# Patient Record
Sex: Female | Born: 1950 | Race: White | Hispanic: No | Smoking: Never smoker
Health system: Southern US, Community
[De-identification: ages and names within clinical notes are randomized; demographics above are authoritative.]

---

## 2003-04-15 ENCOUNTER — Emergency Department (HOSPITAL_COMMUNITY): Admission: EM | Admit: 2003-04-15 | Discharge: 2003-04-15 | Payer: Self-pay | Admitting: Emergency Medicine

## 2005-10-29 ENCOUNTER — Ambulatory Visit (HOSPITAL_BASED_OUTPATIENT_CLINIC_OR_DEPARTMENT_OTHER): Admission: RE | Admit: 2005-10-29 | Discharge: 2005-10-29 | Payer: Self-pay | Admitting: Orthopedic Surgery

## 2010-06-12 ENCOUNTER — Ambulatory Visit: Payer: Self-pay | Admitting: Diagnostic Radiology

## 2010-06-12 ENCOUNTER — Emergency Department (HOSPITAL_BASED_OUTPATIENT_CLINIC_OR_DEPARTMENT_OTHER): Admission: EM | Admit: 2010-06-12 | Discharge: 2010-06-13 | Payer: Self-pay | Admitting: Emergency Medicine

## 2010-06-16 ENCOUNTER — Emergency Department (HOSPITAL_BASED_OUTPATIENT_CLINIC_OR_DEPARTMENT_OTHER): Admission: EM | Admit: 2010-06-16 | Discharge: 2010-06-16 | Payer: Self-pay | Admitting: Emergency Medicine

## 2010-06-16 ENCOUNTER — Ambulatory Visit: Payer: Self-pay | Admitting: Diagnostic Radiology

## 2011-01-02 LAB — URINALYSIS, ROUTINE W REFLEX MICROSCOPIC
Glucose, UA: NEGATIVE mg/dL
Protein, ur: NEGATIVE mg/dL
Urobilinogen, UA: 0.2 mg/dL (ref 0.0–1.0)

## 2011-01-02 LAB — BASIC METABOLIC PANEL
BUN: 12 mg/dL (ref 6–23)
CO2: 27 mEq/L (ref 19–32)
Calcium: 9 mg/dL (ref 8.4–10.5)
Chloride: 108 mEq/L (ref 96–112)
Creatinine, Ser: 0.9 mg/dL (ref 0.4–1.2)
GFR calc non Af Amer: >60 mL/min (ref >60)
Glucose, Bld: 86 mg/dL (ref 70–99)
Sodium: 142 mEq/L (ref 135–145)

## 2011-01-02 LAB — URINE MICROSCOPIC-ADD ON

## 2011-03-06 NOTE — Op Note (Signed)
Morgan, Woodard              ACCOUNT NO.:  [id number]   MEDICAL RECORD NO.:  [id number]          PATIENT TYPE:  AMB   LOCATION:  DSC                          FACILITY:  [facility]   PHYSICIAN:  Rodney A. Mortenson, M.D.DATE OF BIRTH:  [birth year]   DATE OF PROCEDURE:  10/29/2005  DATE OF DISCHARGE:                                 OPERATIVE REPORT   PREOPERATIVE DIAGNOSIS:  Partial versus full thickness rotator cuff tear,  right shoulder.   POSTOPERATIVE DIAGNOSIS:  Impingement syndrome with secondary full thickness  nonretracted rotator cuff tear, supraspinatus, right shoulder.   OPERATION PERFORMED:  Diagnostic arthroscopy; open acromioplasty and  excision of full thickness degenerative tear and repair of full thickness  tear, rotator cuff, right shoulder.   SURGEON:  Lenard Galloway. Chaney Malling, M.D.   ANESTHESIA:  General.   DESCRIPTION OF PROCEDURE:  Patient placed on the operating table in supine  position.  After satisfactory general anesthesia, the patient was placed in  semiseated position.  The right upper extremity and shoulder were then  prepped with DuraPrep and draped out in the usual manner. Through a  posterior operative portal, the arthroscope was introduced.  An anterior  operative portal was then made and an operative cannula was inserted.  A  very careful examination of the shoulder was undertaken.  The articular  cartilage of the humeral head and the glenoid appeared normal.  The biceps  appeared normal throughout its course.  The labrum is normal.  Adjacent to  the biceps there is marked fraying and tearing of the articular surface of  the supraspinatus just proximal to its distal insertion.  An 18 gauge needle  was then inserted transcutaneously from outside the shoulder down into the  area of the tear.  This was then stained with methylene blue and the needle  was withdrawn.  The arthroscope was then removed and placed in the  subacromial space. In the area of  the staining with the methylene blue there  was marked fraying and tearing of the cuff on the bursal side of the  shoulder and marked fraying and tearing communicated with the deeper areas.  This was a full thickness nondisplaced tear.  The arthroscope was removed.   A saber cut incision was made over the anterolateral aspect of the shoulder.  Skin edges were retracted.  Bleeders were coagulated.  Deltoid fibers were  released off the anterior aspect of the shoulder only.  The bursa was  excised.  The area of staining could clearly be seen and there was fraying  of the superficial surface of the bursal side of the cuff.  An acromioplasty  was then done and this gave excellent access to the area. The area that was  stained with methylene blue was then excised and degenerative tissue was  seen throughout the thickness of the cuff.  The edges were then sewn back  together side-to-side using Tycron sutures.  A watertight closure was  achieved.  Throughout the procedure, the shoulder was irrigated with copious  amounts of saline solution.  The deltoid fibers were then reattached with 0  Vicryl  and 2-0 Vicryl was used to close the subcutaneous tissue.  Stainless  steel staples used to close the skin.  A small dressing was applied and the  patient returned to the recovery room in excellent condition.  The patient  did have a shoulder block.   FOLLOW-UP CARE:  1.  To my office in [facility] on Thursday.  2.  At that time will start physical therapy.  3.  Percocet for pain.           ______________________________  Lenard Galloway. Chaney Malling, M.D.     RAM/MEDQ  D:  10/29/2005  T:  10/30/2005  Job:  [id number]

## 2011-11-07 ENCOUNTER — Other Ambulatory Visit: Payer: Self-pay

## 2011-11-07 ENCOUNTER — Encounter (HOSPITAL_COMMUNITY): Payer: Self-pay | Admitting: Emergency Medicine

## 2011-11-07 ENCOUNTER — Emergency Department (HOSPITAL_COMMUNITY): Payer: 59

## 2011-11-07 ENCOUNTER — Emergency Department (HOSPITAL_COMMUNITY)
Admission: EM | Admit: 2011-11-07 | Discharge: 2011-11-07 | Disposition: A | Discharge status: Home / Self Care | Payer: 59 | Attending: Emergency Medicine | Admitting: Emergency Medicine

## 2011-11-07 DIAGNOSIS — R269 Unspecified abnormalities of gait and mobility: Secondary | ICD-10-CM | POA: Insufficient documentation

## 2011-11-07 DIAGNOSIS — R109 Unspecified abdominal pain: Secondary | ICD-10-CM | POA: Insufficient documentation

## 2011-11-07 DIAGNOSIS — M549 Dorsalgia, unspecified: Secondary | ICD-10-CM

## 2011-11-07 DIAGNOSIS — M546 Pain in thoracic spine: Secondary | ICD-10-CM | POA: Insufficient documentation

## 2011-11-07 LAB — POCT I-STAT, CHEM 8
Creatinine, Ser: 0.8 mg/dL (ref 0.50–1.10)
Glucose, Bld: 104 mg/dL — ABNORMAL HIGH (ref 70–99)
HCT: 38 % (ref 36.0–46.0)
TCO2: 23 mmol/L (ref 0–100)

## 2011-11-07 MED ORDER — IBUPROFEN 800 MG PO TABS: 800.0000 mg | ORAL_TABLET | Freq: Three times a day (TID) | ORAL | Status: AC

## 2011-11-07 MED ORDER — ONDANSETRON HCL 4 MG/2ML IJ SOLN
4.0000 mg | Freq: Once | INTRAMUSCULAR | Status: AC
  Administered 2011-11-07: 4 mg via INTRAVENOUS
  Filled 2011-11-07: qty 2

## 2011-11-07 MED ORDER — IOHEXOL 300 MG/ML  SOLN
100.0000 mL | Freq: Once | INTRAMUSCULAR | Status: AC | PRN
  Administered 2011-11-07: 100 mL via INTRAVENOUS

## 2011-11-07 MED ORDER — OXYCODONE-ACETAMINOPHEN 5-325 MG PO TABS: 2.0000 | ORAL_TABLET | ORAL | Status: AC | PRN

## 2011-11-07 MED ORDER — FENTANYL CITRATE 0.05 MG/ML IJ SOLN
50.0000 ug | Freq: Once | INTRAMUSCULAR | Status: AC
  Administered 2011-11-07: 50 ug via INTRAVENOUS
  Filled 2011-11-07: qty 2

## 2011-11-07 MED ORDER — OXYCODONE-ACETAMINOPHEN 5-325 MG PO TABS
2.0000 | ORAL_TABLET | Freq: Once | ORAL | Status: AC
  Administered 2011-11-07: 2 via ORAL
  Filled 2011-11-07: qty 2
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
## 2011-11-07 NOTE — ED Notes (Signed)
Pt tolerating gingerale well. No distress.

## 2011-11-07 NOTE — ED Notes (Signed)
Pt ambulated with no difficulty.  Pt unable to find 2nd sock. Looked in room and sheets and with clothing and unable to find.  I did not see her with socks on, they had been removed when I assessed her.

## 2011-11-07 NOTE — ED Notes (Signed)
Attempted to ambulate pt pt refused states needs to wait a few more minutes for meds to "start working".
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
## 2017-05-31 ENCOUNTER — Encounter: Payer: Self-pay | Admitting: Emergency Medicine

## 2017-05-31 ENCOUNTER — Emergency Department (INDEPENDENT_AMBULATORY_CARE_PROVIDER_SITE_OTHER)
Admission: EM | Admit: 2017-05-31 | Discharge: 2017-05-31 | Disposition: A | Discharge status: Home / Self Care | Payer: 59 | Source: Home / Self Care | Attending: Family Medicine | Admitting: Family Medicine

## 2017-05-31 DIAGNOSIS — H9201 Otalgia, right ear: Secondary | ICD-10-CM

## 2017-05-31 DIAGNOSIS — H60331 Swimmer's ear, right ear: Secondary | ICD-10-CM | POA: Diagnosis not present

## 2017-05-31 MED ORDER — NEOMYCIN-POLYMYXIN-HC 3.5-10000-1 OT SUSP: 3.0000 [drp] | Freq: Three times a day (TID) | OTIC | 0 refills | Status: AC

## 2017-05-31 NOTE — ED Provider Notes (Signed)
[facility]    CSN: [id number] Arrival date & time: 05/31/17  1908     History   Chief Complaint Chief Complaint  Patient presents with  . Otalgia    HPI Morgan Woodard is a 66 y.o. female.   Patient states that she has been swimming recently and her right ear became clogged about 8 days ago, and she has had some discharge from her right ear.  During the past 2 to 3 days the discomfort has increased.  She has become fatigued, and developed chills/sweats.   The history is provided by the patient.    History reviewed. No pertinent past medical history.  There are no active problems to display for this patient.   History reviewed. No pertinent surgical history.  OB History    No data available       Home Medications    Prior to Admission medications   Medication Sig Start Date End Date Taking? Authorizing Provider  Multiple Vitamin (MULITIVITAMIN WITH MINERALS) TABS Take 1 tablet by mouth daily. 2 chewables every day    [provider]  neomycin-polymyxin-hydrocortisone (CORTISPORIN) 3.5-10000-1 OTIC suspension Place 3 drops into the right ear 3 (three) times daily. 05/31/17   Lattie Haw, MD  POTASSIUM PO Take 1 tablet by mouth daily.    [provider]    Family History No family history on file.  Social History Social History  Substance Use Topics  . Smoking status: Never Smoker  . Smokeless tobacco: Never Used  . Alcohol use No     Allergies   Vicodin [hydrocodone-acetaminophen]   Review of Systems Review of Systems No sore throat No cough No pleuritic pain No wheezing + nasal congestion No post-nasal drainage No sinus pain/pressure No itchy/red eyes ? earache No hemoptysis No SOB No fever, + chills/sweats No nausea No vomiting No abdominal pain No diarrhea No urinary symptoms No skin rash + fatigue No myalgias No headache Used OTC meds without relief   Physical Exam Triage Vital Signs ED  Triage Vitals  Enc Vitals Group     BP 05/31/17 1937 119/84     Pulse Rate 05/31/17 1937 86     Resp --      Temp 05/31/17 1937 98.3 F (36.8 C)     Temp Source 05/31/17 1937 Oral     SpO2 05/31/17 1937 98 %     Weight 05/31/17 1940 166 lb (75.3 kg)     Height 05/31/17 1940 5\' 4"  (1.626 m)     Head Circumference --      Peak Flow --      Pain Score 05/31/17 1940 9     Pain Loc --      Pain Edu? --      Excl. in GC? --    No data found.   Updated Vital Signs BP 119/84 (BP Location: Left Arm)   Pulse 86   Temp 98.3 F (36.8 C) (Oral)   Ht 5\' 4"  (1.626 m)   Wt 166 lb (75.3 kg)   SpO2 98%   BMI 28.49 kg/m   Visual Acuity Right Eye Distance:   Left Eye Distance:   Bilateral Distance:    Right Eye Near:   Left Eye Near:    Bilateral Near:     Physical Exam Nursing notes and Vital Signs reviewed. Appearance:  Patient appears stated age, and in no acute distress Eyes:  Pupils are equal, round, and reactive to light and accomodation.  Extraocular  movement is intact.  Conjunctivae are not inflamed  Ears:   Right external auditory canal is tender to insertion of speculum and mildly erythematous.  Left canal normal. Tympanic membranes normal.  Nose:  Mildly congested turbinates.  No sinus tenderness.    Pharynx:  Normal Neck:  Supple.  Enlarged posterior/lateral nodes are palpated bilaterally, tender to palpation on the left.   Lungs:  Clear to auscultation.  Breath sounds are equal.  Moving air well. Heart:  Regular rate and rhythm without murmurs, rubs, or gallops.  Abdomen:  Nontender without masses or hepatosplenomegaly.  Bowel sounds are present.  No CVA or flank tenderness.  Extremities:  No edema.  Skin:  No rash present.    UC Treatments / Results  Labs (all labs ordered are listed, but only abnormal results are displayed) Labs Reviewed -  Tympanometry:  Right ear tympanogram normal; Left ear tympanogram normal.  EKG  EKG Interpretation None        Radiology No results found.  Procedures Procedures (including critical care time)  Medications Ordered in UC Medications - No data to display   Initial Impression / Assessment and Plan / UC Course  I have reviewed the triage vital signs and the nursing notes.  Pertinent labs & imaging results that were available during my care of the patient were reviewed by me and considered in my medical decision making (see chart for details).    Begin Cortisporin otic suspension to right ear. Suspect early developing viral URI. If cold-like symptoms develop, try the following: Take plain guaifenesin (1200mg  extended release tabs such as Mucinex) twice daily, with plenty of water, for cough and congestion.  May add Pseudoephedrine (30mg , one or two every 4 to 6 hours) for sinus congestion.  Get adequate rest.   May use Afrin nasal spray (or generic oxymetazoline) each morning for about 5 days and then discontinue.  Also recommend using saline nasal spray several times daily and saline nasal irrigation (AYR is a common brand).  Use Flonase nasal spray each morning after using Afrin nasal spray and saline nasal irrigation. Try warm salt water gargles for sore throat.  May take Delsym Cough Suppressant at bedtime for nighttime cough.  Stop all antihistamines for now, and other non-prescription cough/cold preparations. May take Ibuprofen 200mg , 4 tabs every 8 hours with food for sore throat, body aches, joint aches, etc.   Follow-up with family doctor if not improving about 7 to10 days.     Final Clinical Impressions(s) / UC Diagnoses   Final diagnoses:  Otalgia of right ear  Acute swimmer's ear of right side    New Prescriptions New Prescriptions   NEOMYCIN-POLYMYXIN-HYDROCORTISONE (CORTISPORIN) 3.5-10000-1 OTIC SUSPENSION    Place 3 drops into the right ear 3 (three) times daily.        Lattie HawBeese, Jahmeer Porche A, MD 06/08/17 1253

## 2017-05-31 NOTE — Discharge Instructions (Signed)
If cold-like symptoms develop, try the following: Take plain guaifenesin (1200mg  extended release tabs such as Mucinex) twice daily, with plenty of water, for cough and congestion.  May add Pseudoephedrine (30mg , one or two every 4 to 6 hours) for sinus congestion.  Get adequate rest.   May use Afrin nasal spray (or generic oxymetazoline) each morning for about 5 days and then discontinue.  Also recommend using saline nasal spray several times daily and saline nasal irrigation (AYR is a common brand).  Use Flonase nasal spray each morning after using Afrin nasal spray and saline nasal irrigation. Try warm salt water gargles for sore throat.  May take Delsym Cough Suppressant at bedtime for nighttime cough.  Stop all antihistamines for now, and other non-prescription cough/cold preparations. May take Ibuprofen 200mg , 4 tabs every 8 hours with food for sore throat, body aches, joint aches, etc.   Follow-up with family doctor if not improving about 7 to10 days.

## 2017-05-31 NOTE — ED Triage Notes (Signed)
Rt earache x 10 days worse last 4 days
